# Patient Record
Sex: Female | Born: 1970 | Race: White | Hispanic: No | Smoking: Never smoker
Health system: Southern US, Community
[De-identification: ages and names within clinical notes are randomized; demographics above are authoritative.]

## PROBLEM LIST (undated history)

## (undated) DIAGNOSIS — I10 Essential (primary) hypertension: Secondary | ICD-10-CM

## (undated) DIAGNOSIS — E119 Type 2 diabetes mellitus without complications: Secondary | ICD-10-CM

## (undated) HISTORY — PX: TUBAL LIGATION: SHX77

---

## 1998-02-28 ENCOUNTER — Emergency Department (HOSPITAL_COMMUNITY): Admission: EM | Admit: 1998-02-28 | Discharge: 1998-02-28 | Payer: Self-pay | Admitting: Emergency Medicine

## 1998-04-23 ENCOUNTER — Inpatient Hospital Stay (HOSPITAL_COMMUNITY): Admission: AD | Admit: 1998-04-23 | Discharge: 1998-04-23 | Payer: Self-pay | Admitting: Obstetrics and Gynecology

## 1998-04-29 ENCOUNTER — Ambulatory Visit (HOSPITAL_COMMUNITY): Admission: RE | Admit: 1998-04-29 | Discharge: 1998-04-29 | Payer: Self-pay | Admitting: Obstetrics & Gynecology

## 1998-05-20 ENCOUNTER — Ambulatory Visit (HOSPITAL_COMMUNITY): Admission: RE | Admit: 1998-05-20 | Discharge: 1998-05-20 | Payer: Self-pay | Admitting: Obstetrics and Gynecology

## 1998-06-21 ENCOUNTER — Inpatient Hospital Stay (HOSPITAL_COMMUNITY): Admission: AD | Admit: 1998-06-21 | Discharge: 1998-06-21 | Payer: Self-pay | Admitting: *Deleted

## 1998-07-09 ENCOUNTER — Inpatient Hospital Stay (HOSPITAL_COMMUNITY): Admission: AD | Admit: 1998-07-09 | Discharge: 1998-07-09 | Payer: Self-pay | Admitting: Obstetrics and Gynecology

## 1998-07-10 ENCOUNTER — Inpatient Hospital Stay (HOSPITAL_COMMUNITY): Admission: AD | Admit: 1998-07-10 | Discharge: 1998-07-10 | Payer: Self-pay | Admitting: Obstetrics & Gynecology

## 1998-07-21 ENCOUNTER — Inpatient Hospital Stay (HOSPITAL_COMMUNITY): Admission: AD | Admit: 1998-07-21 | Discharge: 1998-07-24 | Payer: Self-pay | Admitting: Obstetrics & Gynecology

## 2003-01-06 ENCOUNTER — Encounter: Payer: Self-pay | Admitting: Emergency Medicine

## 2003-01-06 ENCOUNTER — Emergency Department (HOSPITAL_COMMUNITY): Admission: EM | Admit: 2003-01-06 | Discharge: 2003-01-07 | Payer: Self-pay | Admitting: Emergency Medicine

## 2003-04-26 ENCOUNTER — Other Ambulatory Visit: Admission: RE | Admit: 2003-04-26 | Discharge: 2003-04-26 | Payer: Self-pay | Admitting: Family Medicine

## 2004-04-24 ENCOUNTER — Other Ambulatory Visit: Admission: RE | Admit: 2004-04-24 | Discharge: 2004-04-24 | Payer: Self-pay | Admitting: Family Medicine

## 2005-04-13 ENCOUNTER — Ambulatory Visit (HOSPITAL_COMMUNITY): Admission: RE | Admit: 2005-04-13 | Discharge: 2005-04-13 | Payer: Self-pay | Admitting: Family Medicine

## 2005-06-17 ENCOUNTER — Other Ambulatory Visit: Admission: RE | Admit: 2005-06-17 | Discharge: 2005-06-17 | Payer: Self-pay | Admitting: Obstetrics and Gynecology

## 2007-11-18 ENCOUNTER — Other Ambulatory Visit: Admission: RE | Admit: 2007-11-18 | Discharge: 2007-11-18 | Payer: Self-pay | Admitting: Family Medicine

## 2008-06-26 ENCOUNTER — Inpatient Hospital Stay (HOSPITAL_COMMUNITY): Admission: AD | Admit: 2008-06-26 | Discharge: 2008-06-26 | Payer: Self-pay | Admitting: Obstetrics & Gynecology

## 2009-07-21 ENCOUNTER — Emergency Department (HOSPITAL_COMMUNITY): Admission: EM | Admit: 2009-07-21 | Discharge: 2009-07-22 | Payer: Self-pay | Admitting: Emergency Medicine

## 2009-08-28 ENCOUNTER — Ambulatory Visit (HOSPITAL_COMMUNITY): Admission: RE | Admit: 2009-08-28 | Discharge: 2009-08-28 | Payer: Self-pay | Admitting: Obstetrics and Gynecology

## 2009-10-02 ENCOUNTER — Ambulatory Visit (HOSPITAL_COMMUNITY): Admission: RE | Admit: 2009-10-02 | Discharge: 2009-10-02 | Payer: Self-pay | Admitting: Obstetrics & Gynecology

## 2009-12-09 ENCOUNTER — Ambulatory Visit: Payer: Self-pay | Admitting: Obstetrics & Gynecology

## 2009-12-09 ENCOUNTER — Inpatient Hospital Stay (HOSPITAL_COMMUNITY): Admission: AD | Admit: 2009-12-09 | Discharge: 2009-12-09 | Payer: Self-pay | Admitting: Obstetrics & Gynecology

## 2009-12-10 ENCOUNTER — Encounter: Payer: Self-pay | Admitting: Family

## 2009-12-12 ENCOUNTER — Ambulatory Visit: Payer: Self-pay | Admitting: Obstetrics & Gynecology

## 2009-12-16 ENCOUNTER — Ambulatory Visit (HOSPITAL_COMMUNITY): Admission: RE | Admit: 2009-12-16 | Discharge: 2009-12-16 | Payer: Self-pay | Admitting: Obstetrics & Gynecology

## 2009-12-16 ENCOUNTER — Ambulatory Visit: Payer: Self-pay | Admitting: Obstetrics & Gynecology

## 2009-12-16 ENCOUNTER — Encounter: Admission: RE | Admit: 2009-12-16 | Discharge: 2009-12-16 | Payer: Self-pay | Admitting: Obstetrics & Gynecology

## 2009-12-19 ENCOUNTER — Ambulatory Visit: Payer: Self-pay | Admitting: Obstetrics & Gynecology

## 2009-12-24 ENCOUNTER — Ambulatory Visit (HOSPITAL_COMMUNITY): Admission: RE | Admit: 2009-12-24 | Discharge: 2009-12-24 | Payer: Self-pay | Admitting: Obstetrics & Gynecology

## 2009-12-24 ENCOUNTER — Ambulatory Visit: Payer: Self-pay | Admitting: Obstetrics and Gynecology

## 2009-12-27 ENCOUNTER — Ambulatory Visit: Payer: Self-pay | Admitting: Obstetrics & Gynecology

## 2009-12-30 ENCOUNTER — Ambulatory Visit: Payer: Self-pay | Admitting: Obstetrics & Gynecology

## 2009-12-30 LAB — CONVERTED CEMR LAB
Chlamydia, DNA Probe: NEGATIVE
GC Probe Amp, Genital: NEGATIVE

## 2010-01-06 ENCOUNTER — Ambulatory Visit: Payer: Self-pay | Admitting: Obstetrics & Gynecology

## 2010-01-06 ENCOUNTER — Ambulatory Visit (HOSPITAL_COMMUNITY): Admission: RE | Admit: 2010-01-06 | Discharge: 2010-01-06 | Payer: Self-pay | Admitting: Obstetrics & Gynecology

## 2010-01-09 ENCOUNTER — Ambulatory Visit: Payer: Self-pay | Admitting: Obstetrics & Gynecology

## 2010-01-13 ENCOUNTER — Ambulatory Visit: Payer: Self-pay | Admitting: Obstetrics & Gynecology

## 2010-01-15 ENCOUNTER — Inpatient Hospital Stay (HOSPITAL_COMMUNITY): Admission: RE | Admit: 2010-01-15 | Discharge: 2010-01-18 | Payer: Self-pay | Admitting: Obstetrics & Gynecology

## 2010-01-15 ENCOUNTER — Ambulatory Visit: Payer: Self-pay | Admitting: Obstetrics & Gynecology

## 2010-01-15 ENCOUNTER — Encounter: Payer: Self-pay | Admitting: Obstetrics & Gynecology

## 2010-09-06 LAB — CBC
HCT: 27 % — ABNORMAL LOW (ref 36.0–46.0)
HCT: 31.2 % — ABNORMAL LOW (ref 36.0–46.0)
Hemoglobin: 10.5 g/dL — ABNORMAL LOW (ref 12.0–15.0)
Hemoglobin: 9.3 g/dL — ABNORMAL LOW (ref 12.0–15.0)
MCH: 28.2 pg (ref 26.0–34.0)
MCH: 29 pg (ref 26.0–34.0)
MCHC: 33.5 g/dL (ref 30.0–36.0)
MCHC: 34.3 g/dL (ref 30.0–36.0)
MCV: 84.1 fL (ref 78.0–100.0)
MCV: 84.6 fL (ref 78.0–100.0)
Platelets: 187 10*3/uL (ref 150–400)
Platelets: 200 10*3/uL (ref 150–400)
RBC: 3.2 MIL/uL — ABNORMAL LOW (ref 3.87–5.11)
RBC: 3.71 MIL/uL — ABNORMAL LOW (ref 3.87–5.11)
RDW: 14.8 % (ref 11.5–15.5)
RDW: 15.3 % (ref 11.5–15.5)
WBC: 10.1 10*3/uL (ref 4.0–10.5)
WBC: 9.3 10*3/uL (ref 4.0–10.5)

## 2010-09-06 LAB — RH IMMUNE GLOB WKUP(>/=20WKS)(NOT WOMEN'S HOSP): Fetal Screen: NEGATIVE

## 2010-09-06 LAB — BASIC METABOLIC PANEL
BUN: 4 mg/dL — ABNORMAL LOW (ref 6–23)
CO2: 25 mEq/L (ref 19–32)
Calcium: 8.8 mg/dL (ref 8.4–10.5)
Chloride: 107 mEq/L (ref 96–112)
Creatinine, Ser: 0.46 mg/dL (ref 0.4–1.2)
GFR calc Af Amer: >60 mL/min (ref >60)
GFR calc non Af Amer: >60 mL/min (ref >60)
Glucose, Bld: 140 mg/dL — ABNORMAL HIGH (ref 70–99)
Potassium: 3.7 mEq/L (ref 3.5–5.1)
Sodium: 138 mEq/L (ref 135–145)

## 2010-09-06 LAB — POCT URINALYSIS DIP (DEVICE)
Bilirubin Urine: NEGATIVE
Glucose, UA: NEGATIVE mg/dL
Glucose, UA: NEGATIVE mg/dL
Hgb urine dipstick: NEGATIVE
Hgb urine dipstick: NEGATIVE
Ketones, ur: NEGATIVE mg/dL
Ketones, ur: NEGATIVE mg/dL
Nitrite: NEGATIVE
Nitrite: NEGATIVE
Protein, ur: 30 mg/dL — AB
Protein, ur: 30 mg/dL — AB
Specific Gravity, Urine: 1.025 (ref 1.005–1.030)
Specific Gravity, Urine: >=1.03 (ref 1.005–1.030)
Urobilinogen, UA: 0.2 mg/dL (ref 0.0–1.0)
Urobilinogen, UA: 0.2 mg/dL (ref 0.0–1.0)
pH: 5.5 (ref 5.0–8.0)
pH: 6 (ref 5.0–8.0)

## 2010-09-06 LAB — GLUCOSE, CAPILLARY
Glucose-Capillary: 118 mg/dL — ABNORMAL HIGH (ref 70–99)
Glucose-Capillary: 92 mg/dL (ref 70–99)

## 2010-09-06 LAB — TYPE AND SCREEN
ABO/RH(D): O NEG
Antibody Screen: NEGATIVE

## 2010-09-06 LAB — SURGICAL PCR SCREEN
MRSA, PCR: NEGATIVE
Staphylococcus aureus: NEGATIVE

## 2010-09-06 LAB — CCBB MATERNAL DONOR DRAW

## 2010-09-06 LAB — RPR: RPR Ser Ql: NONREACTIVE

## 2010-09-07 LAB — POCT URINALYSIS DIP (DEVICE)
Bilirubin Urine: NEGATIVE
Glucose, UA: 100 mg/dL — AB
Glucose, UA: NEGATIVE mg/dL
Ketones, ur: 15 mg/dL — AB
Ketones, ur: 15 mg/dL — AB
Nitrite: NEGATIVE
Nitrite: NEGATIVE
Protein, ur: 30 mg/dL — AB
Protein, ur: 30 mg/dL — AB
Specific Gravity, Urine: >=1.03 (ref 1.005–1.030)
Urobilinogen, UA: 0.2 mg/dL (ref 0.0–1.0)
Urobilinogen, UA: 0.2 mg/dL (ref 0.0–1.0)
pH: 5.5 (ref 5.0–8.0)

## 2010-09-07 LAB — GLUCOSE, CAPILLARY: Glucose-Capillary: 124 mg/dL — ABNORMAL HIGH (ref 70–99)

## 2010-09-07 LAB — CBC
MCV: 85.8 fL (ref 78.0–100.0)
Platelets: 211 10*3/uL (ref 150–400)
WBC: 9 10*3/uL (ref 4.0–10.5)

## 2010-09-07 LAB — FIBRINOGEN: Fibrinogen: 652 mg/dL — ABNORMAL HIGH (ref 204–475)

## 2010-09-07 LAB — PROTIME-INR: Prothrombin Time: 12.9 seconds (ref 11.6–15.2)

## 2010-09-07 LAB — TYPE AND SCREEN: Antibody Screen: NEGATIVE

## 2010-10-06 LAB — CBC
HCT: 37.7 % (ref 36.0–46.0)
Hemoglobin: 13 g/dL (ref 12.0–15.0)
MCV: 86.4 fL (ref 78.0–100.0)
RBC: 4.36 MIL/uL (ref 3.87–5.11)
WBC: 8.1 10*3/uL (ref 4.0–10.5)

## 2010-10-06 LAB — GC/CHLAMYDIA PROBE AMP, GENITAL
Chlamydia, DNA Probe: NEGATIVE
GC Probe Amp, Genital: NEGATIVE

## 2010-10-06 LAB — TYPE AND SCREEN: Antibody Screen: NEGATIVE

## 2010-10-06 LAB — ABO/RH: ABO/RH(D): O NEG

## 2011-03-09 IMAGING — US US OB FOLLOW-UP
1 series · 14 of 28 positions shown · non-contrast
Comparison: none

OBSTETRICAL ULTRASOUND:
 This ultrasound exam was performed in the [HOSPITAL] Ultrasound Department.  The OB US report was generated in the AS system, and faxed to the ordering physician.  This report is also available in [HOSPITAL]?s AccessANYware and in [REDACTED] PACS.

[Series 1: us fetal bpp w/o nonstress · non-contrast · 0.39mm/px · 47 acquisitions, 14 frames shown]
[im 2/47]
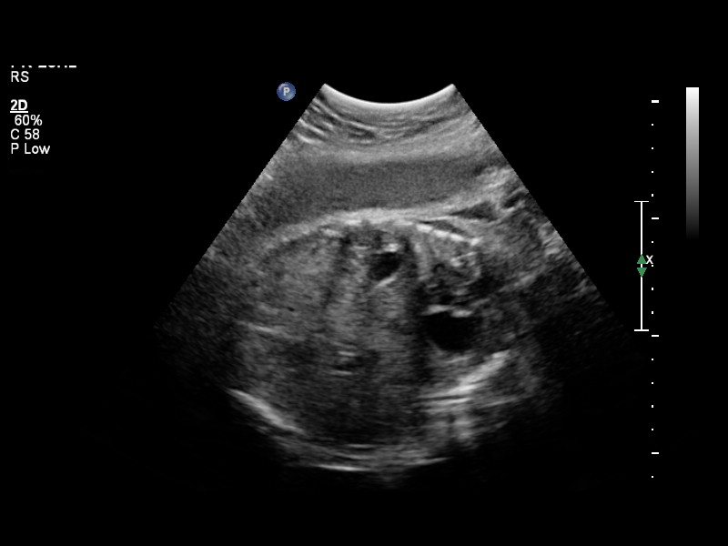
[im 6/47]
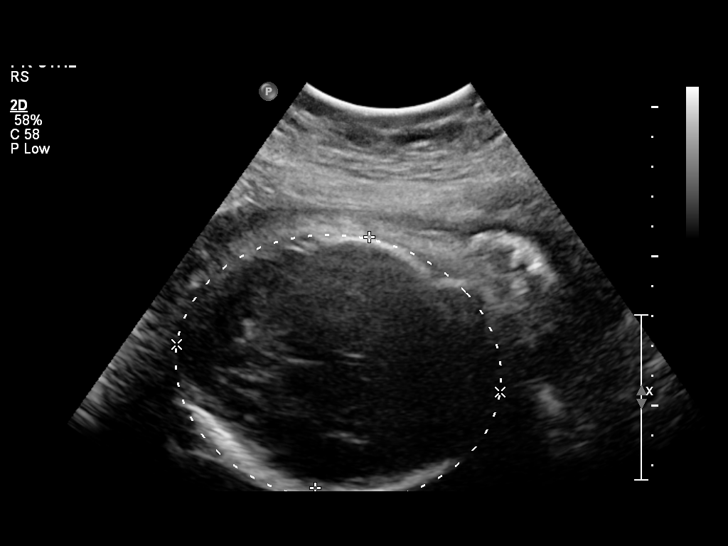
[im 9/47]
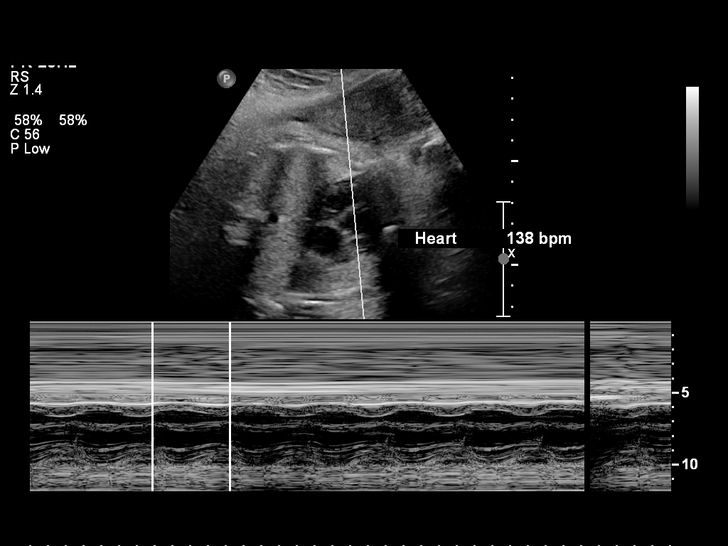
[im 12/47]
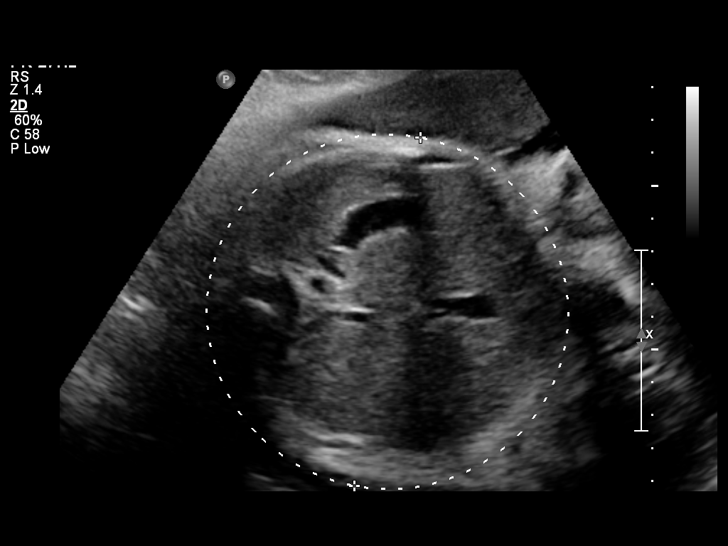
[im 16/47]
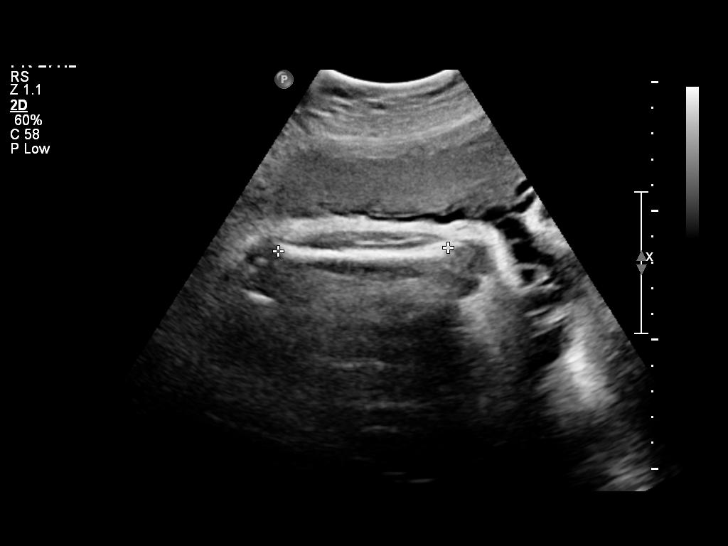
[im 19/47]
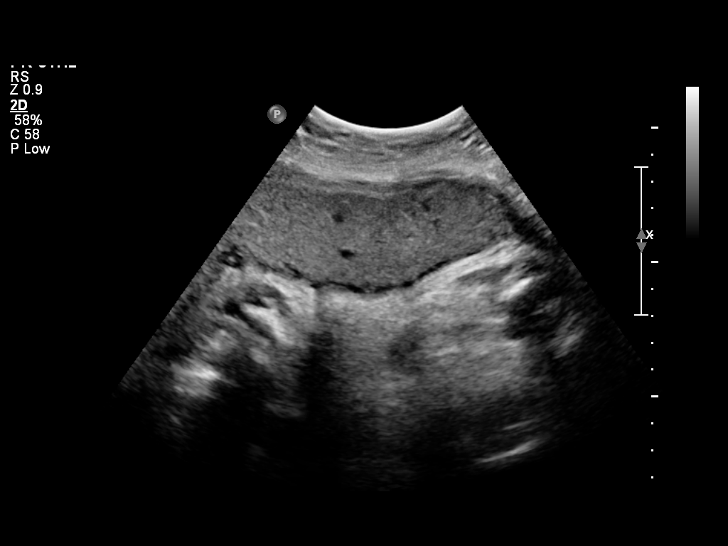
[im 23/47]
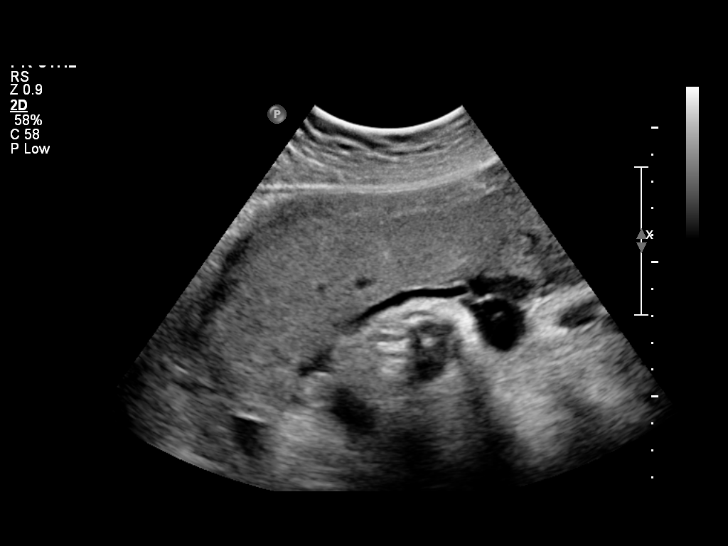
[im 26/47]
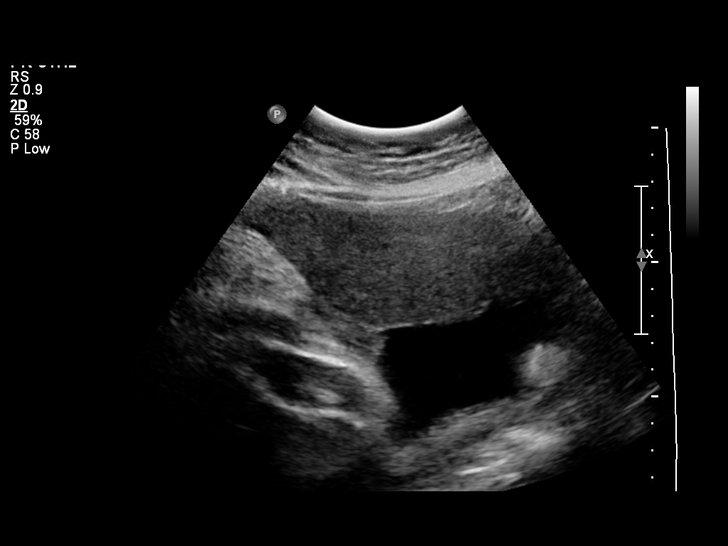
[im 29/47]
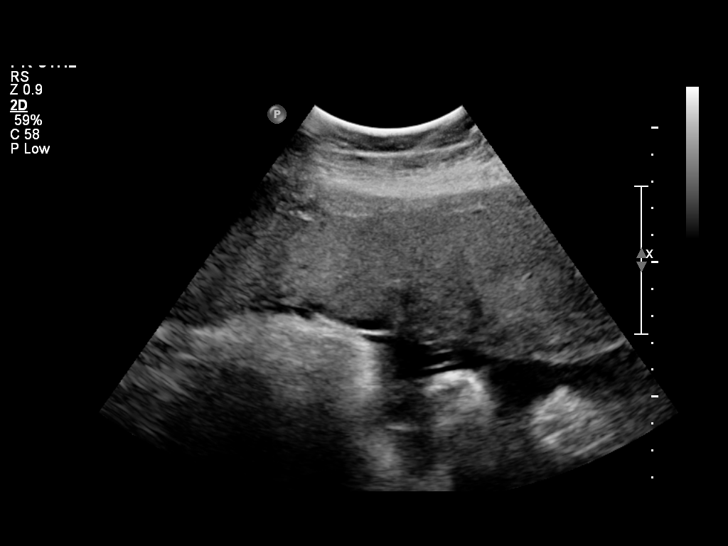
[im 33/47]
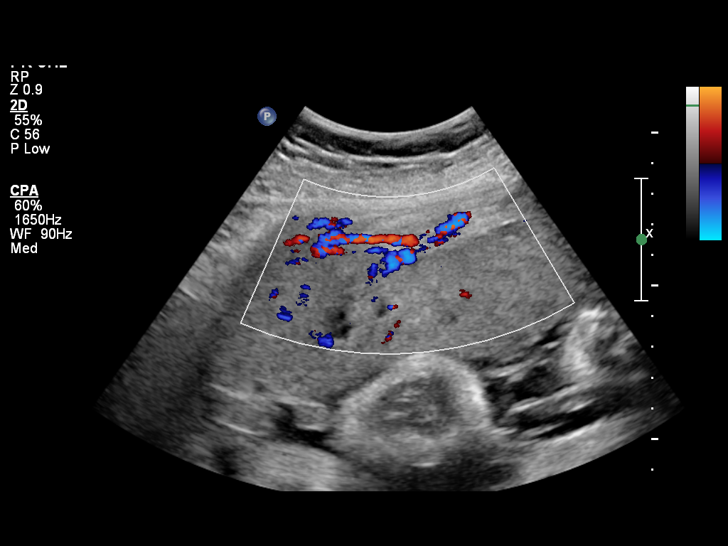
[im 36/47]
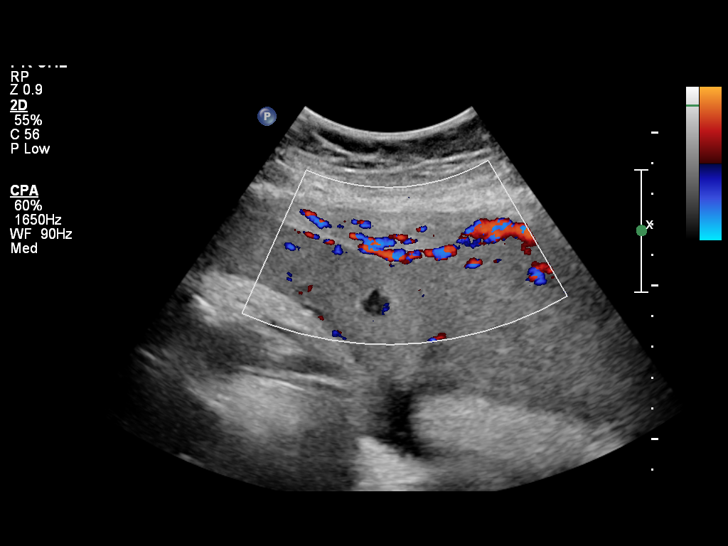
[im 40/47]
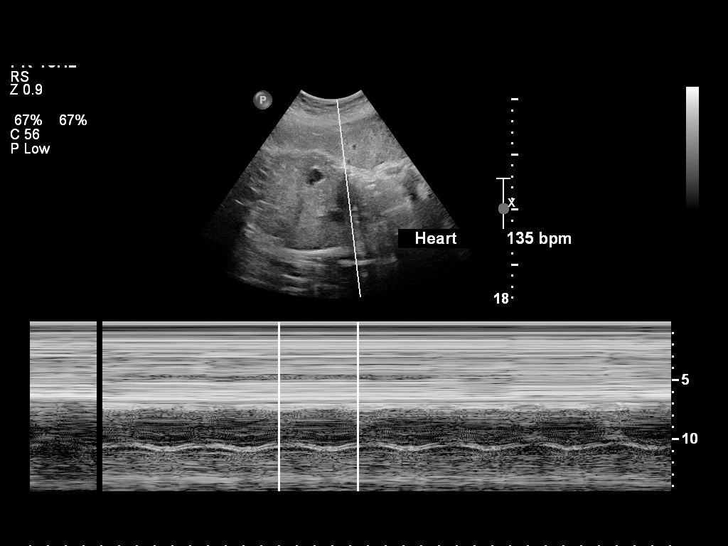
[im 43/47]
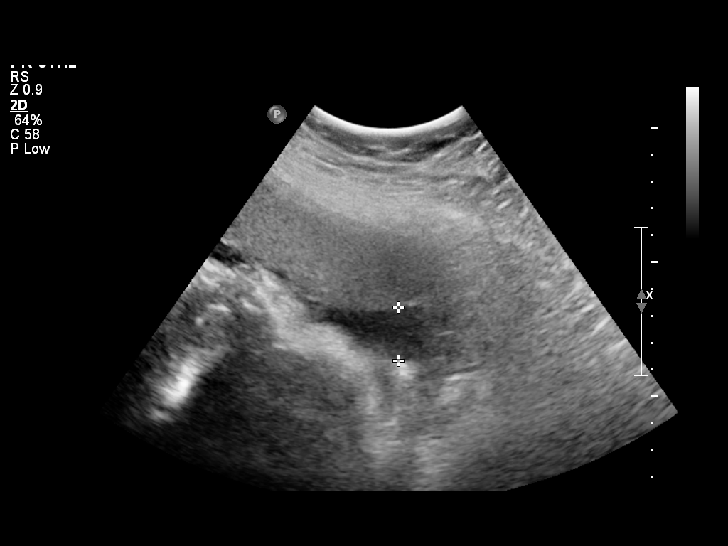
[im 47/47]
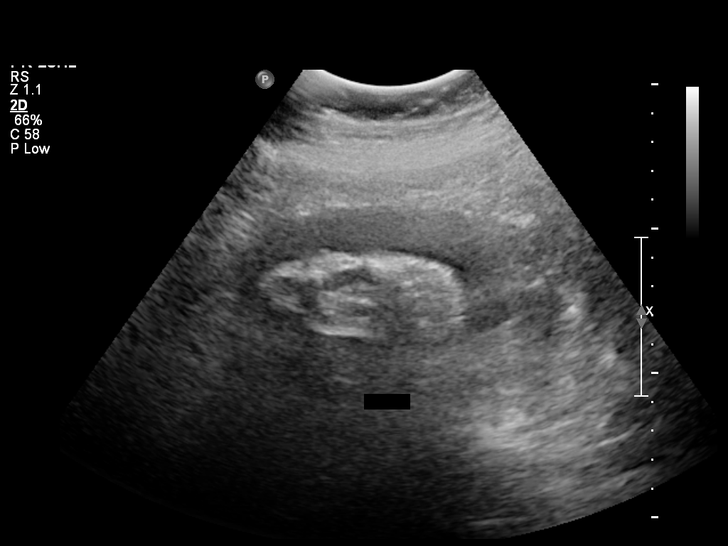

[14 of 28 positions shown; findings below may reference images not displayed]

IMPRESSION: See AS Obstetric US report.

## 2011-05-29 ENCOUNTER — Other Ambulatory Visit: Payer: Self-pay | Admitting: Physician Assistant

## 2011-05-29 ENCOUNTER — Other Ambulatory Visit (HOSPITAL_COMMUNITY)
Admission: RE | Admit: 2011-05-29 | Discharge: 2011-05-29 | Disposition: A | Discharge status: Home / Self Care | Payer: BC Managed Care – PPO | Source: Ambulatory Visit | Attending: Family Medicine | Admitting: Family Medicine

## 2011-05-29 DIAGNOSIS — Z01419 Encounter for gynecological examination (general) (routine) without abnormal findings: Secondary | ICD-10-CM | POA: Insufficient documentation

## 2012-01-29 ENCOUNTER — Other Ambulatory Visit: Payer: Self-pay | Admitting: Family Medicine

## 2012-01-29 DIAGNOSIS — Z1231 Encounter for screening mammogram for malignant neoplasm of breast: Secondary | ICD-10-CM

## 2012-02-15 ENCOUNTER — Ambulatory Visit
Admission: RE | Admit: 2012-02-15 | Discharge: 2012-02-15 | Disposition: A | Discharge status: Home / Self Care | Payer: BC Managed Care – PPO | Source: Ambulatory Visit | Attending: Family Medicine | Admitting: Family Medicine

## 2012-02-15 DIAGNOSIS — Z1231 Encounter for screening mammogram for malignant neoplasm of breast: Secondary | ICD-10-CM

## 2013-05-31 ENCOUNTER — Ambulatory Visit (HOSPITAL_COMMUNITY): Payer: 59 | Attending: Family Medicine | Admitting: Cardiology

## 2013-05-31 ENCOUNTER — Encounter (INDEPENDENT_AMBULATORY_CARE_PROVIDER_SITE_OTHER): Payer: Self-pay

## 2013-05-31 ENCOUNTER — Other Ambulatory Visit (HOSPITAL_COMMUNITY): Payer: Self-pay | Admitting: Family Medicine

## 2013-05-31 DIAGNOSIS — R0609 Other forms of dyspnea: Secondary | ICD-10-CM | POA: Insufficient documentation

## 2013-05-31 DIAGNOSIS — I10 Essential (primary) hypertension: Secondary | ICD-10-CM | POA: Insufficient documentation

## 2013-05-31 DIAGNOSIS — Z6841 Body Mass Index (BMI) 40.0 and over, adult: Secondary | ICD-10-CM | POA: Insufficient documentation

## 2013-05-31 DIAGNOSIS — E669 Obesity, unspecified: Secondary | ICD-10-CM | POA: Insufficient documentation

## 2013-05-31 DIAGNOSIS — R0602 Shortness of breath: Secondary | ICD-10-CM

## 2013-05-31 DIAGNOSIS — R0989 Other specified symptoms and signs involving the circulatory and respiratory systems: Secondary | ICD-10-CM | POA: Insufficient documentation

## 2013-05-31 DIAGNOSIS — R079 Chest pain, unspecified: Secondary | ICD-10-CM | POA: Insufficient documentation

## 2013-05-31 NOTE — Progress Notes (Signed)
Echo performed. 

## 2013-11-05 ENCOUNTER — Emergency Department (HOSPITAL_COMMUNITY)
Admission: EM | Admit: 2013-11-05 | Discharge: 2013-11-06 | Disposition: A | Discharge status: Home / Self Care | Payer: 59 | Attending: Emergency Medicine | Admitting: Emergency Medicine

## 2013-11-05 ENCOUNTER — Emergency Department (HOSPITAL_COMMUNITY): Payer: 59

## 2013-11-05 ENCOUNTER — Encounter (HOSPITAL_COMMUNITY): Payer: Self-pay | Admitting: Emergency Medicine

## 2013-11-05 DIAGNOSIS — F430 Acute stress reaction: Secondary | ICD-10-CM | POA: Insufficient documentation

## 2013-11-05 DIAGNOSIS — R739 Hyperglycemia, unspecified: Secondary | ICD-10-CM

## 2013-11-05 DIAGNOSIS — R079 Chest pain, unspecified: Secondary | ICD-10-CM

## 2013-11-05 DIAGNOSIS — R0602 Shortness of breath: Secondary | ICD-10-CM | POA: Insufficient documentation

## 2013-11-05 DIAGNOSIS — R7309 Other abnormal glucose: Secondary | ICD-10-CM | POA: Insufficient documentation

## 2013-11-05 DIAGNOSIS — F411 Generalized anxiety disorder: Secondary | ICD-10-CM | POA: Insufficient documentation

## 2013-11-05 DIAGNOSIS — R197 Diarrhea, unspecified: Secondary | ICD-10-CM | POA: Insufficient documentation

## 2013-11-05 DIAGNOSIS — R42 Dizziness and giddiness: Secondary | ICD-10-CM | POA: Insufficient documentation

## 2013-11-05 DIAGNOSIS — Z79899 Other long term (current) drug therapy: Secondary | ICD-10-CM | POA: Insufficient documentation

## 2013-11-05 DIAGNOSIS — E876 Hypokalemia: Secondary | ICD-10-CM | POA: Insufficient documentation

## 2013-11-05 HISTORY — DX: Essential (primary) hypertension: I10

## 2013-11-05 LAB — BASIC METABOLIC PANEL
BUN: 13 mg/dL (ref 6–23)
CALCIUM: 9.7 mg/dL (ref 8.4–10.5)
CHLORIDE: 92 meq/L — AB (ref 96–112)
CO2: 28 meq/L (ref 19–32)
Creatinine, Ser: 0.73 mg/dL (ref 0.50–1.10)
GFR calc Af Amer: >90 mL/min (ref >90)
GFR calc non Af Amer: >90 mL/min (ref >90)
GLUCOSE: 293 mg/dL — AB (ref 70–99)
POTASSIUM: 3.1 meq/L — AB (ref 3.7–5.3)
SODIUM: 135 meq/L — AB (ref 137–147)

## 2013-11-05 LAB — CBC
HCT: 39.2 % (ref 36.0–46.0)
Hemoglobin: 13.4 g/dL (ref 12.0–15.0)
MCH: 28.6 pg (ref 26.0–34.0)
MCHC: 34.2 g/dL (ref 30.0–36.0)
MCV: 83.8 fL (ref 78.0–100.0)
Platelets: 244 K/uL (ref 150–400)
RBC: 4.68 MIL/uL (ref 3.87–5.11)
RDW: 13.3 % (ref 11.5–15.5)
WBC: 9.1 K/uL (ref 4.0–10.5)

## 2013-11-05 LAB — I-STAT TROPONIN, ED
TROPONIN I, POC: 0 ng/mL (ref 0.00–0.08)
Troponin i, poc: 0 ng/mL (ref 0.00–0.08)

## 2013-11-05 MED ORDER — SODIUM CHLORIDE 0.9 % IV BOLUS (SEPSIS)
1000.0000 mL | Freq: Once | INTRAVENOUS | Status: AC
  Administered 2013-11-05: 1000 mL via INTRAVENOUS

## 2013-11-05 MED ORDER — POTASSIUM CHLORIDE CRYS ER 20 MEQ PO TBCR
40.0000 meq | EXTENDED_RELEASE_TABLET | Freq: Once | ORAL | Status: AC
  Administered 2013-11-05: 40 meq via ORAL
  Filled 2013-11-05: qty 2

## 2013-11-05 NOTE — ED Notes (Addendum)
[facility] and started having stressful conversation with daughter. Did report feeling anxious. Starting feeling left chest pain radiating to left arm around 1900. Felt SOB and dizziness with near syncope. Pt reports she stayed fully conscious. Had diarrhea during episode but no vomiting. No vision changes. Hx sleep apnea (wears CPAP at night), HTN. States that she has had these episodes in the past. One episode explained-was at work and suddenly felt very SOB with sharp chest pains. Also had vomiting. Seen at [place] where BP was noted to be 230 systolic. Was given BP medicine (doesn't remember) and morphine. Was diagnosed with gall stones. Still has gall bladder. C/o chronic pain. Family hx (mother) of CHF. No family hx of early cardiac death. In NAD. Ambulatory. A&O x4.

## 2013-11-05 NOTE — ED Notes (Signed)
Bed: WA20 Expected date:  Expected time:  Means of arrival:  Comments: EMS/42 yo chest pain

## 2013-11-05 NOTE — ED Notes (Signed)
Per EMS-pt out to eat with family today and stated that her and her daughter got into a stressful conversation and chest started hurting. Started a job working night shift and has had less sleep. Takes care of her children and grandchildren by herself and has felt more and more stressed. 12 Lead showed ST 112 bpm. Initially had left jaw pain and left shoulder pain. Has hx of neck pain from injury and causes intermittent jaw pain. Left shoulder tender to palpation.Takes HCTZ. Family hx of heart failure. Pt has no cardiac hx. VS: BP 136/76 HR 112 SpO2 100% on RA. Pain c/o 2/10. Pt took 81 mg Aspirin before EMS pick up.

## 2013-11-05 NOTE — ED Provider Notes (Signed)
CSN: [id number]     Arrival date & time 11/05/13  2023 History   First MD Initiated Contact with Patient 11/05/13 2046     Chief Complaint  Patient presents with  . Chest Pain  . Anxiety     (Consider location/radiation/quality/duration/timing/severity/associated sxs/prior Treatment) HPI  43 year old female who presents complaining of chest discomfort. Patient was brought here via EMS. Patient reports for the past 3-4 days she has been overworked, working 12hr night shift 4 days in a row not getting adequate sleep.  She also involved in a stressful conversation with her daughter while she was at a restaurant approximately 2 hrs ago.  She subsequently developed L upper chest pressure, radiates to her L arm with associated dizziness and sob.  Sxs has been persistent for the past 2 hrs.  She report having diarrhea at the restaurant and also diarrhea at home.  She did take a baby ASA and call EMS to bring her to ER.  She denies prior hx of cardiac disease.  Does take HCTZ PRN for fluid retention only.  Report family hx of heart failure.  Pt otherwise has not hx of HTN, HLD, DM or other cardiac risk factor.  Pt is not a smoker.  No prior hx of PE/DVT, no recent surgery, prolonged bed rest, hx cancer, unilateral leg swelling or calf pain, not taking birth control pill.    No past medical history on file. No past surgical history on file. No family history on file. History  Substance Use Topics  . Smoking status: Not on file  . Smokeless tobacco: Not on file  . Alcohol Use: Not on file   OB History   No data available     Review of Systems  All other systems reviewed and are negative.     Allergies  Codeine  Home Medications   Prior to Admission medications   Medication Sig Start Date End Date Taking? Authorizing Provider  B Complex-C (B-COMPLEX WITH VITAMIN C) tablet Take 1 tablet by mouth daily.   Yes Historical Provider, MD  Cholecalciferol (VITAMIN D3) 3000 UNITS TABS Take  3,000 Units by mouth daily.   Yes Historical Provider, MD  hydrochlorothiazide (HYDRODIURIL) 25 MG tablet Take 25-50 mg by mouth daily as needed (fluid).   Yes Historical Provider, MD  ibuprofen (ADVIL,MOTRIN) 200 MG tablet Take 800 mg by mouth every 6 (six) hours as needed for moderate pain.   Yes Historical Provider, MD  Multiple Vitamin (MULTIVITAMIN WITH MINERALS) TABS tablet Take 1 tablet by mouth daily.   Yes Historical Provider, MD   There were no vitals taken for this visit. Physical Exam  Nursing note and vitals reviewed. Constitutional: She is oriented to person, place, and time. She appears well-developed and well-nourished. No distress.  Moderately obese Caucasian female without any acute distress.    HENT:  Head: Atraumatic.  Eyes: Conjunctivae are normal.  Neck: Neck supple. No JVD present.  Cardiovascular: Normal rate and regular rhythm.   Pulmonary/Chest: Effort normal and breath sounds normal. No respiratory distress. She has no wheezes. She has no rales. She exhibits tenderness (tenderness to L upper chest on palpation without crepitus, emphysema, no rash. ).  Abdominal: Soft. Bowel sounds are normal. There is no tenderness.  Musculoskeletal: She exhibits no edema.  Neurological: She is alert and oriented to person, place, and time.  Skin: No rash noted.  Psychiatric: She has a normal mood and affect.    ED Course  Procedures (including critical care time)  9:10 PM Pt here with reproducible chest pain that is atypical of ACS.  CP may also be stress related.  Although she is mildly tachycardic, she score low on the Wells criteria.  TIMI 1 (from taking a baby ASA PTA). Work up initiated.  Ativan given for anxiety  11:05 PM EKG shows no acute ischemic changes. She has negative delta troponin.   Her CXR unremarkable.  She does have evidence of undiagnosed DM with CBG 293.  Evidence of hypokalemia of 3.1, supplementation given.  Pt report her sxs nearly completely resolved.   She is resting comfortably.    11:32 PM Pt however is tachycardic, and given her elevated CBG, will give IVF. Plan to d/c with metformin.    1:05 AM Pt is mildly tachycardic after receiving IVF.  She however ambulate without hypoxia, no cough, no hemoptysis and she feels fine.  I did mention that i cannot rule out a PE as a cause and offer option of D-dimer and possibility of chest CTA to r/o.  Pt prefers to go home, she understand the risk and is willing to return promptly if her sxs worsen.  Pt also agrees to f/u closely with her PCP.    Labs Review Labs Reviewed  BASIC METABOLIC PANEL - Abnormal; Notable for the following:    Sodium 135 (*)    Potassium 3.1 (*)    Chloride 92 (*)    Glucose, Bld 293 (*)    All other components within normal limits  URINALYSIS, ROUTINE W REFLEX MICROSCOPIC - Abnormal; Notable for the following:    Glucose, UA >1000 (*)    All other components within normal limits  CBC  URINE MICROSCOPIC-ADD ON  Rosezena SensorI-STAT TROPOININ, ED  Rosezena SensorI-STAT TROPOININ, ED    Imaging Review Dg Chest Port 1 View  11/05/2013   CLINICAL DATA:  CHEST PAIN ANXIETY  EXAM: PORTABLE CHEST - 1 VIEW  COMPARISON:  DG CHEST PORTABLE dated 08/03/2010  FINDINGS: The heart size and mediastinal contours are within normal limits. Both lungs are clear. The visualized skeletal structures are unremarkable.  IMPRESSION: No active disease.   Electronically Signed   By: Salome HolmesHector  Cooper M.D.   On: 11/05/2013 21:32     EKG Interpretation None      Date: 11/05/2013  Rate: 99  Rhythm: normal sinus rhythm  QRS Axis: normal  Intervals: normal  ST/T Wave abnormalities: normal  Conduction Disutrbances: none  Narrative Interpretation:   Old EKG Reviewed: No significant changes noted     MDM   Final diagnoses:  Chest pain at rest  Hyperglycemia  Hypokalemia    BP 125/72  Pulse 111  Temp(Src) 98.3 F (36.8 C) (Oral)  Resp 16  SpO2 98%  LMP 11/01/2013  I have reviewed nursing notes and vital  signs. I personally reviewed the imaging tests through PACS system  I reviewed available ER/hospitalization records thought the EMR     Fayrene HelperBowie De Libman, [place]-C 11/06/13 0114

## 2013-11-06 LAB — URINALYSIS, ROUTINE W REFLEX MICROSCOPIC
Bilirubin Urine: NEGATIVE
Glucose, UA: >1000 mg/dL — AB
Hgb urine dipstick: NEGATIVE
KETONES UR: NEGATIVE mg/dL
LEUKOCYTES UA: NEGATIVE
NITRITE: NEGATIVE
PROTEIN: NEGATIVE mg/dL
Specific Gravity, Urine: 1.011 (ref 1.005–1.030)
UROBILINOGEN UA: 0.2 mg/dL (ref 0.0–1.0)
pH: 6 (ref 5.0–8.0)

## 2013-11-06 LAB — URINE MICROSCOPIC-ADD ON

## 2013-11-06 MED ORDER — METFORMIN HCL 500 MG PO TABS: 500.0000 mg | ORAL_TABLET | Freq: Two times a day (BID) | ORAL | Status: AC

## 2013-11-06 NOTE — ED Notes (Signed)
Pt stayed above 97% while ambulating

## 2013-11-06 NOTE — Discharge Instructions (Signed)
You have evidence of diabetes, please follow up closely with your doctor for further management.  Please return if you develop cough, cough up blood, having shortness of breath or if you have other concerns.    Chest Pain Observation It is often hard to give a specific diagnosis for the cause of chest pain. Among other possibilities your symptoms might be caused by inadequate oxygen delivery to your heart (angina). Angina that is not treated or evaluated can lead to a heart attack (myocardial infarction) or death. Blood tests, electrocardiograms, and X-rays may have been done to help determine a possible cause of your chest pain. After evaluation and observation, your health care provider has determined that it is unlikely your pain was caused by an unstable condition that requires hospitalization. However, a full evaluation of your pain may need to be completed, with additional diagnostic testing as directed. It is very important to keep your follow-up appointments. Not keeping your follow-up appointments could result in permanent heart damage, disability, or death. If there is any problem keeping your follow-up appointments, you must call your health care provider. HOME CARE INSTRUCTIONS  Due to the slight chance that your pain could be angina, it is important to follow your health care provider's treatment plan and also maintain a healthy lifestyle:  Maintain or work toward achieving a healthy weight.  Stay physically active and exercise regularly.  Decrease your salt intake.  Eat a balanced, healthy diet. Talk to a dietician to learn about heart healthy foods.  Increase your fiber intake by including whole grains, vegetables, fruits, and nuts in your diet.  Avoid situations that cause stress, anger, or depression.  Take medicines as advised by your health care provider. Report any side effects to your health care provider. Do not stop medicines or adjust the dosages on your own.  Quit  smoking. Do not use nicotine patches or gum until you check with your health care provider.  Keep your blood pressure, blood sugar, and cholesterol levels within normal limits.  Limit alcohol intake to no more than 1 drink per day for women that are not pregnant and 2 drinks per day for men.  Do not abuse drugs. SEEK IMMEDIATE MEDICAL CARE IF: You have severe chest pain or pressure which may include symptoms such as:  You feel pain or pressure in you arms, neck, jaw, or back.  You have severe back or abdominal pain, feel sick to your stomach (nauseous), or throw up (vomit).  You are sweating profusely.  You are having a fast or irregular heartbeat.  You feel short of breath while at rest.  You notice increasing shortness of breath during rest, sleep, or with activity.  You have chest pain that does not get better after rest or after taking your usual medicine.  You wake from sleep with chest pain.  You are unable to sleep because you cannot breathe.  You develop a frequent cough or you are coughing up blood.  You feel dizzy, faint, or experience extreme fatigue.  You develop severe weakness, dizziness, fainting, or chills. Any of these symptoms may represent a serious problem that is an emergency. Do not wait to see if the symptoms will go away. Call your local emergency services (911 in the [place] Do not drive yourself to the hospital. MAKE SURE YOU:  Understand these instructions.  Will watch your condition.  Will get help right away if you are not doing well or get worse. Document Released: 07/11/2010 Document Revised: 02/08/2013  Document Reviewed: 12/08/2012 [facility] Patient Information 2014 [place], [place].  Hyperglycemia Hyperglycemia occurs when the glucose (sugar) in your blood is too high. Hyperglycemia can happen for many reasons, but it most often happens to people who do not know they have diabetes or are not managing their diabetes properly.  CAUSES    Whether you have diabetes or not, there are other causes of hyperglycemia. Hyperglycemia can occur when you have diabetes, but it can also occur in other situations that you might not be as aware of, such as: Diabetes  If you have diabetes and are having problems controlling your blood glucose, hyperglycemia could occur because of some of the following reasons:  Not following your meal plan.  Not taking your diabetes medications or not taking it properly.  Exercising less or doing less activity than you normally do.  Being sick. Pre-diabetes  This cannot be ignored. Before people develop Type 2 diabetes, they almost always have "pre-diabetes." This is when your blood glucose levels are higher than normal, but not yet high enough to be diagnosed as diabetes. Research has shown that some long-term damage to the body, especially the heart and circulatory system, may already be occurring during pre-diabetes. If you take action to manage your blood glucose when you have pre-diabetes, you may delay or prevent Type 2 diabetes from developing. Stress  If you have diabetes, you may be "diet" controlled or on oral medications or insulin to control your diabetes. However, you may find that your blood glucose is higher than usual in the hospital whether you have diabetes or not. This is often referred to as "stress hyperglycemia." Stress can elevate your blood glucose. This happens because of hormones put out by the body during times of stress. If stress has been the cause of your high blood glucose, it can be followed regularly by your caregiver. That way he/she can make sure your hyperglycemia does not continue to get worse or progress to diabetes. Steroids  Steroids are medications that act on the infection fighting system (immune system) to block inflammation or infection. One side effect can be a rise in blood glucose. Most people can produce enough extra insulin to allow for this rise, but for those  who cannot, steroids make blood glucose levels go even higher. It is not unusual for steroid treatments to "uncover" diabetes that is developing. It is not always possible to determine if the hyperglycemia will go away after the steroids are stopped. A special blood test called an A1c is sometimes done to determine if your blood glucose was elevated before the steroids were started. SYMPTOMS  Thirsty.  Frequent urination.  Dry mouth.  Blurred vision.  Tired or fatigue.  Weakness.  Sleepy.  Tingling in feet or leg. DIAGNOSIS  Diagnosis is made by monitoring blood glucose in one or all of the following ways:  A1c test. This is a chemical found in your blood.  Fingerstick blood glucose monitoring.  Laboratory results. TREATMENT  First, knowing the cause of the hyperglycemia is important before the hyperglycemia can be treated. Treatment may include, but is not be limited to:  Education.  Change or adjustment in medications.  Change or adjustment in meal plan.  Treatment for an illness, infection, etc.  More frequent blood glucose monitoring.  Change in exercise plan.  Decreasing or stopping steroids.  Lifestyle changes. HOME CARE INSTRUCTIONS   Test your blood glucose as directed.  Exercise regularly. Your caregiver will give you instructions about exercise. Pre-diabetes or diabetes  which comes on with stress is helped by exercising.  Eat wholesome, balanced meals. Eat often and at regular, fixed times. Your caregiver or nutritionist will give you a meal plan to guide your sugar intake.  Being at an ideal weight is important. If needed, losing as little as 10 to 15 pounds may help improve blood glucose levels. SEEK MEDICAL CARE IF:   You have questions about medicine, activity, or diet.  You continue to have symptoms (problems such as increased thirst, urination, or weight gain). SEEK IMMEDIATE MEDICAL CARE IF:   You are vomiting or have diarrhea.  Your  breath smells fruity.  You are breathing faster or slower.  You are very sleepy or incoherent.  You have numbness, tingling, or pain in your feet or hands.  You have chest pain.  Your symptoms get worse even though you have been following your caregiver's orders.  If you have any other questions or concerns. Document Released: 12/02/2000 Document Revised: 08/31/2011 Document Reviewed: 10/05/2011 [facility]ville[facility] Patient Information 2014 [place]ExitCare, [place]LLC.

## 2013-11-09 NOTE — ED Provider Notes (Signed)
Medical screening examination/treatment/procedure(s) were performed by non-physician practitioner and as supervising physician I was immediately available for consultation/collaboration.   EKG Interpretation   Date/Time:  Sunday Nov 05 2013 20:30:04 EDT Ventricular Rate:  99 PR Interval:  147 QRS Duration: 92 QT Interval:  359 QTC Calculation: 461 R Axis:   4 Text Interpretation:  Sinus rhythm Low voltage, precordial leads No old  tracing to compare Confirmed by [facility] HospitalWENTZ  MD, ELLIOTT [id number]) on 11/05/2013  10:54:50 PM       Juliet RudeNathan R. Rubin PayorPickering, MD 11/09/13 [phone]

## 2015-02-03 IMAGING — CR DG CHEST 1V PORT
1 series · 1 of 1 positions shown · non-contrast
Comparison: DG CHEST PORTABLE dated 08/03/2010

CLINICAL DATA: CHEST PAIN ANXIETY

EXAM:
PORTABLE CHEST - 1 VIEW

[AP]
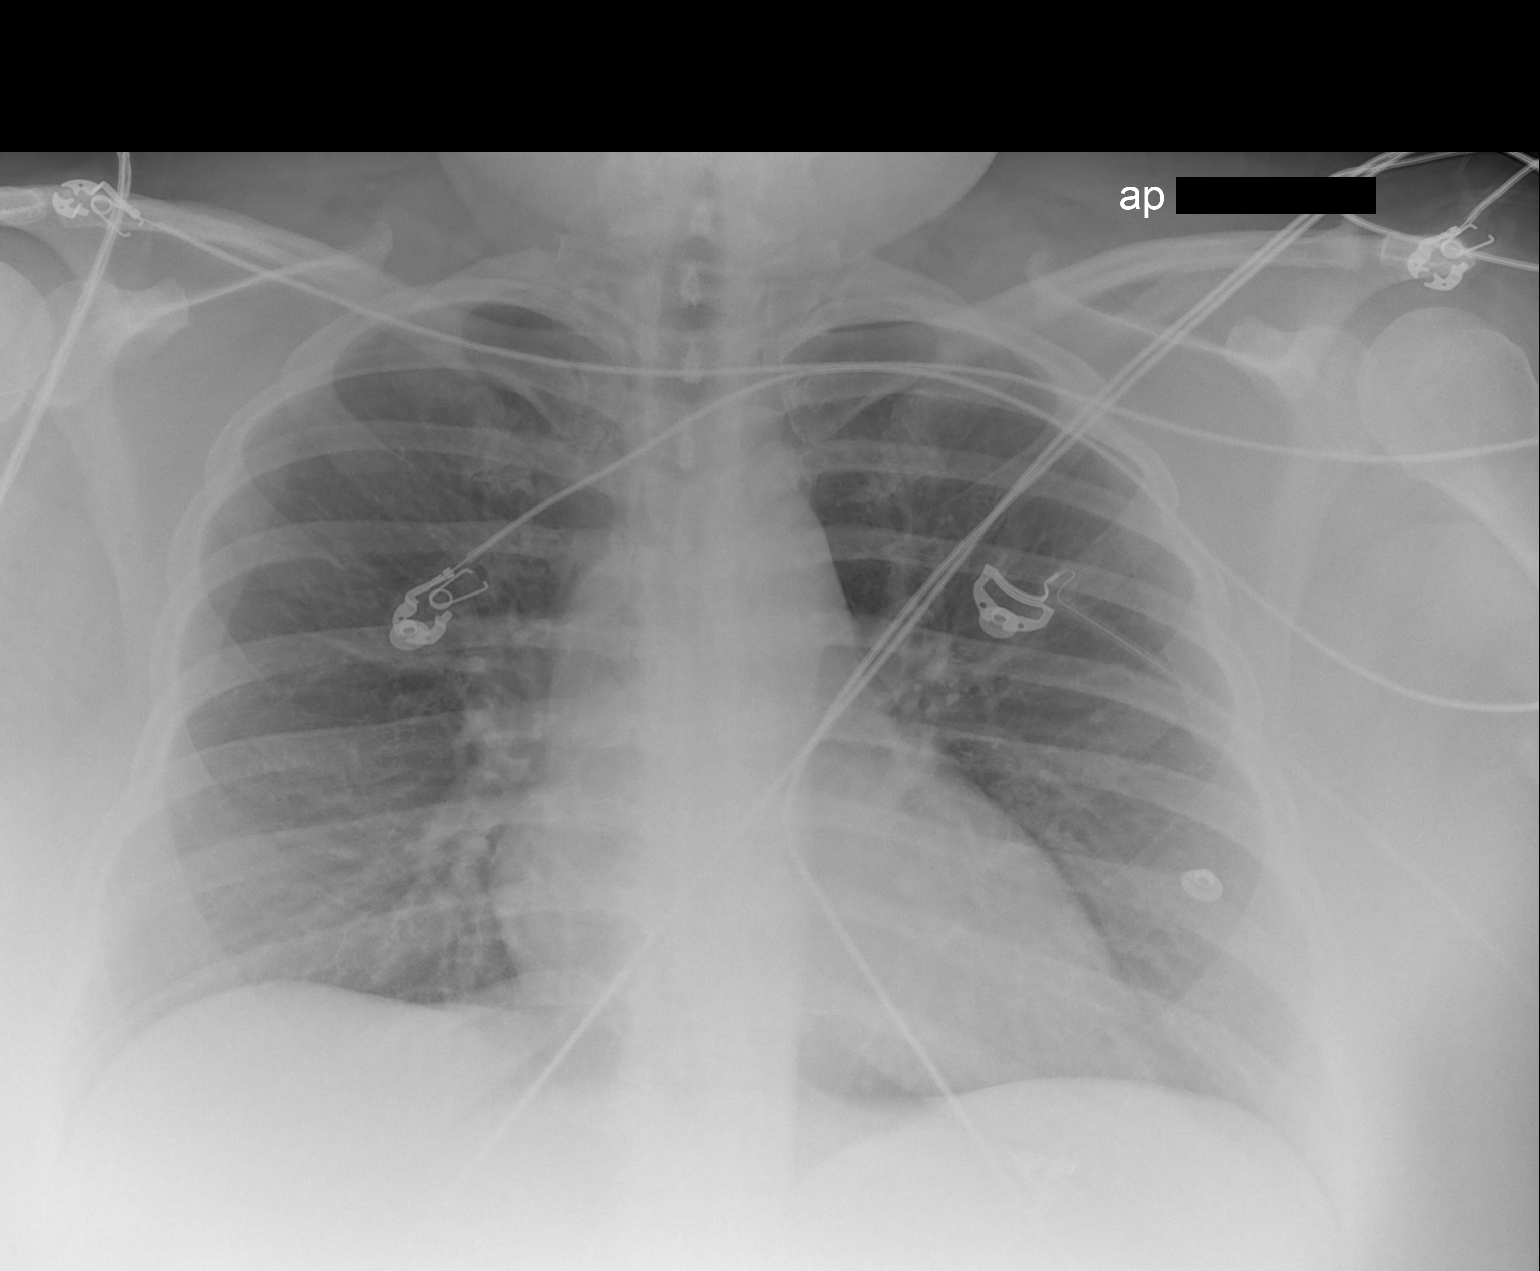

[1 of 1 positions shown; findings below may reference images not displayed]

FINDINGS: The heart size and mediastinal contours are within normal limits.
Both lungs are clear. The visualized skeletal structures are
unremarkable.
IMPRESSION: No active disease.

## 2015-07-31 ENCOUNTER — Emergency Department (HOSPITAL_COMMUNITY)
Admission: EM | Admit: 2015-07-31 | Discharge: 2015-07-31 | Disposition: A | Discharge status: Home / Self Care | Payer: Self-pay | Attending: Emergency Medicine | Admitting: Emergency Medicine

## 2015-07-31 ENCOUNTER — Encounter (HOSPITAL_COMMUNITY): Payer: Self-pay | Admitting: Family Medicine

## 2015-07-31 DIAGNOSIS — R220 Localized swelling, mass and lump, head: Secondary | ICD-10-CM | POA: Insufficient documentation

## 2015-07-31 DIAGNOSIS — E119 Type 2 diabetes mellitus without complications: Secondary | ICD-10-CM | POA: Insufficient documentation

## 2015-07-31 DIAGNOSIS — I10 Essential (primary) hypertension: Secondary | ICD-10-CM | POA: Insufficient documentation

## 2015-07-31 HISTORY — DX: Type 2 diabetes mellitus without complications: E11.9

## 2015-07-31 NOTE — ED Notes (Addendum)
Patient called 2X for repeat vitals with no response

## 2015-07-31 NOTE — ED Notes (Signed)
Pt here for swelling to throat. sts she noticed it yesterday. sts making her acid reflux worse and making it hard to swallow.

## 2017-02-24 ENCOUNTER — Other Ambulatory Visit: Payer: Self-pay | Admitting: Family Medicine

## 2017-02-24 DIAGNOSIS — Z1231 Encounter for screening mammogram for malignant neoplasm of breast: Secondary | ICD-10-CM

## 2017-03-01 ENCOUNTER — Ambulatory Visit
Admission: RE | Admit: 2017-03-01 | Discharge: 2017-03-01 | Disposition: A | Discharge status: Home / Self Care | Payer: No Typology Code available for payment source | Source: Ambulatory Visit | Attending: Family Medicine | Admitting: Family Medicine

## 2017-03-01 DIAGNOSIS — Z1231 Encounter for screening mammogram for malignant neoplasm of breast: Secondary | ICD-10-CM

## 2018-03-21 ENCOUNTER — Other Ambulatory Visit: Payer: Self-pay

## 2018-03-21 ENCOUNTER — Emergency Department (HOSPITAL_COMMUNITY): Payer: Self-pay

## 2018-03-21 ENCOUNTER — Emergency Department (HOSPITAL_COMMUNITY)
Admission: EM | Admit: 2018-03-21 | Discharge: 2018-03-21 | Disposition: A | Discharge status: Home / Self Care | Payer: Self-pay | Attending: Emergency Medicine | Admitting: Emergency Medicine

## 2018-03-21 ENCOUNTER — Encounter (HOSPITAL_COMMUNITY): Payer: Self-pay | Admitting: *Deleted

## 2018-03-21 DIAGNOSIS — K8020 Calculus of gallbladder without cholecystitis without obstruction: Secondary | ICD-10-CM | POA: Insufficient documentation

## 2018-03-21 DIAGNOSIS — I10 Essential (primary) hypertension: Secondary | ICD-10-CM | POA: Insufficient documentation

## 2018-03-21 DIAGNOSIS — E876 Hypokalemia: Secondary | ICD-10-CM | POA: Insufficient documentation

## 2018-03-21 DIAGNOSIS — Z79899 Other long term (current) drug therapy: Secondary | ICD-10-CM | POA: Insufficient documentation

## 2018-03-21 DIAGNOSIS — R10811 Right upper quadrant abdominal tenderness: Secondary | ICD-10-CM

## 2018-03-21 DIAGNOSIS — Z7984 Long term (current) use of oral hypoglycemic drugs: Secondary | ICD-10-CM | POA: Insufficient documentation

## 2018-03-21 DIAGNOSIS — E119 Type 2 diabetes mellitus without complications: Secondary | ICD-10-CM | POA: Insufficient documentation

## 2018-03-21 LAB — CBC WITH DIFFERENTIAL/PLATELET
ABS IMMATURE GRANULOCYTES: 0 10*3/uL (ref 0.0–0.1)
Basophils Absolute: 0.1 10*3/uL (ref 0.0–0.1)
Basophils Relative: 1 %
EOS ABS: 0.3 10*3/uL (ref 0.0–0.7)
Eosinophils Relative: 4 %
HEMATOCRIT: 39.6 % (ref 36.0–46.0)
Hemoglobin: 13 g/dL (ref 12.0–15.0)
Immature Granulocytes: 0 %
LYMPHS ABS: 2.4 10*3/uL (ref 0.7–4.0)
LYMPHS PCT: 30 %
MCH: 29.3 pg (ref 26.0–34.0)
MCHC: 32.8 g/dL (ref 30.0–36.0)
MCV: 89.2 fL (ref 78.0–100.0)
MONO ABS: 0.5 10*3/uL (ref 0.1–1.0)
Monocytes Relative: 6 %
NEUTROS PCT: 59 %
Neutro Abs: 4.7 10*3/uL (ref 1.7–7.7)
Platelets: 205 10*3/uL (ref 150–400)
RBC: 4.44 MIL/uL (ref 3.87–5.11)
RDW: 13.2 % (ref 11.5–15.5)
WBC: 7.9 10*3/uL (ref 4.0–10.5)

## 2018-03-21 LAB — COMPREHENSIVE METABOLIC PANEL
ALT: 25 U/L (ref 0–44)
ANION GAP: 7 (ref 5–15)
AST: 19 U/L (ref 15–41)
Albumin: 3.3 g/dL — ABNORMAL LOW (ref 3.5–5.0)
Alkaline Phosphatase: 48 U/L (ref 38–126)
BUN: 5 mg/dL — ABNORMAL LOW (ref 6–20)
CHLORIDE: 105 mmol/L (ref 98–111)
CO2: 28 mmol/L (ref 22–32)
CREATININE: 0.65 mg/dL (ref 0.44–1.00)
Calcium: 8.7 mg/dL — ABNORMAL LOW (ref 8.9–10.3)
GFR calc Af Amer: >60 mL/min (ref >60)
Glucose, Bld: 121 mg/dL — ABNORMAL HIGH (ref 70–99)
POTASSIUM: 3.1 mmol/L — AB (ref 3.5–5.1)
SODIUM: 140 mmol/L (ref 135–145)
Total Bilirubin: 0.6 mg/dL (ref 0.3–1.2)
Total Protein: 6 g/dL — ABNORMAL LOW (ref 6.5–8.1)

## 2018-03-21 LAB — URINALYSIS, ROUTINE W REFLEX MICROSCOPIC
BILIRUBIN URINE: NEGATIVE
Glucose, UA: NEGATIVE mg/dL
Hgb urine dipstick: NEGATIVE
Ketones, ur: NEGATIVE mg/dL
Leukocytes, UA: NEGATIVE
Nitrite: NEGATIVE
PH: 6 (ref 5.0–8.0)
PROTEIN: NEGATIVE mg/dL
SPECIFIC GRAVITY, URINE: 1.002 — AB (ref 1.005–1.030)

## 2018-03-21 LAB — I-STAT BETA HCG BLOOD, ED (MC, WL, AP ONLY): I-stat hCG, quantitative: <5 m[IU]/mL (ref <5)

## 2018-03-21 LAB — LIPASE, BLOOD: LIPASE: 27 U/L (ref 11–51)

## 2018-03-21 MED ORDER — SODIUM CHLORIDE 0.9 % IV BOLUS
1000.0000 mL | Freq: Once | INTRAVENOUS | Status: AC
  Administered 2018-03-21: 1000 mL via INTRAVENOUS

## 2018-03-21 MED ORDER — PANTOPRAZOLE SODIUM 20 MG PO TBEC: 20.0000 mg | DELAYED_RELEASE_TABLET | Freq: Every day | ORAL | 1 refills | Status: AC

## 2018-03-21 MED ORDER — ONDANSETRON HCL 4 MG/2ML IJ SOLN
4.0000 mg | Freq: Once | INTRAMUSCULAR | Status: AC
  Administered 2018-03-21: 4 mg via INTRAVENOUS
  Filled 2018-03-21: qty 2

## 2018-03-21 MED ORDER — DICYCLOMINE HCL 20 MG PO TABS: 20.0000 mg | ORAL_TABLET | Freq: Two times a day (BID) | ORAL | 0 refills | Status: AC

## 2018-03-21 MED ORDER — FAMOTIDINE 20 MG PO TABS: 20.0000 mg | ORAL_TABLET | Freq: Two times a day (BID) | ORAL | 0 refills | Status: AC

## 2018-03-21 MED ORDER — DICYCLOMINE HCL 10 MG PO CAPS
20.0000 mg | ORAL_CAPSULE | Freq: Once | ORAL | Status: AC
  Administered 2018-03-21: 20 mg via ORAL
  Filled 2018-03-21: qty 2

## 2018-03-21 MED ORDER — ONDANSETRON 4 MG PO TBDP: 4.0000 mg | ORAL_TABLET | Freq: Three times a day (TID) | ORAL | 0 refills | Status: AC | PRN

## 2018-03-21 MED ORDER — MORPHINE SULFATE (PF) 4 MG/ML IV SOLN
4.0000 mg | Freq: Once | INTRAVENOUS | Status: AC
  Administered 2018-03-21: 4 mg via INTRAVENOUS
  Filled 2018-03-21: qty 1

## 2018-03-21 MED ORDER — POTASSIUM CHLORIDE CRYS ER 20 MEQ PO TBCR
40.0000 meq | EXTENDED_RELEASE_TABLET | Freq: Once | ORAL | Status: AC
  Administered 2018-03-21: 40 meq via ORAL
  Filled 2018-03-21: qty 2

## 2018-03-21 NOTE — ED Triage Notes (Signed)
C/o right side lower abd pain onset 1 month ago, c/o nausea, states she has been on the Keto diet and lost 70 pounds, also has a history of gallstones.

## 2018-03-21 NOTE — Discharge Instructions (Addendum)
° °  Diet: Start with a clear liquid diet, progressed to a full liquid diet, and then bland solids as you are able. Please adhere to the enclosed dietary suggestions.  In general, avoid NSAIDs (i.e. ibuprofen, naproxen, etc.), caffeine, alcohol, spicy foods, fatty foods, or any other foods that seem to cause your symptoms to arise.  Protonix: Take this medication daily, 20-30 minutes prior to your first meal, for the next 8 weeks.  Continue to take this medication even if you begin to feel better.  Pepcid: Take this medication twice a day for the next 5 days.  Bentyl: May use the Bentyl, as needed, for abdominal discomfort.  Zofran: May use Zofran, as needed, for nausea.  Follow-up: Please follow-up with the general surgeon on this matter. May also need to follow up with the gastroenterologist.  Return: Return to the ED for significantly worsening symptoms, persistent vomiting, persistent fever, vomiting blood, blood in the stools, dark stools, or any other major concerns.  For prescription assistance, may try using prescription discount sites or apps, such as goodrx.com   Your potassium was noted to be low today.  Treating this may include adjusting her diet accordingly, but follow-up with your primary care provider for repeat testing and management is recommended.

## 2018-03-21 NOTE — ED Notes (Signed)
Patient transported to Ultrasound 

## 2018-03-21 NOTE — ED Notes (Signed)
The pt has been on the  Keto diet and has lost 70 lbs  abd pain and she thinks the diet has caused her gallbladder to act uo

## 2018-03-21 NOTE — ED Provider Notes (Signed)
[facility] EMERGENCY DEPARTMENT Provider Note   CSN: [id number] Arrival date & time: 03/21/18  4098     History   Chief Complaint Chief Complaint  Patient presents with  . Abdominal Pain    HPI Michelle Harrington is a 47 y.o. female.  HPI   Michelle Harrington is a 47 y.o. female, with a history of DM, HTN, and cholelithiasis, presenting to the ED with abdominal pain beginning about a month ago. Pain is RUQ, usually dull, sometimes sharp, waxing and waning, worsening since onset, currently 8/10, with referred pain to right shoulder. Worse with eating fatty foods. Accompanied by nausea, bloating sensation, and belching.   States she was diagnosed with gallstones years ago, was told to see a Careers adviser for cholecystectomy, was unable to afford the surgery at that time.   She started the Keto diet Jan 2019 and has since lost 70 lbs.   Denies fever/chills, SOB, vomiting, diarrhea, urinary symptoms, chest pain, or any other complaints.    Past Medical History:  Diagnosis Date  . Diabetes mellitus without complication (HCC)   . Hypertension     There are no active problems to display for this patient.   Past Surgical History:  Procedure Laterality Date  . TUBAL LIGATION       OB History   None      Home Medications    Prior to Admission medications   Medication Sig Start Date End Date Taking? Authorizing Provider  B Complex-C (B-COMPLEX WITH VITAMIN C) tablet Take 1 tablet by mouth daily.   Yes [provider]  Biotin w/ Vitamins C & E (HAIR/SKIN/NAILS PO) Take 1 tablet by mouth every morning.   Yes [provider]  hydrochlorothiazide (HYDRODIURIL) 25 MG tablet Take 25 mg by mouth daily.    Yes [provider]  ibuprofen (ADVIL,MOTRIN) 200 MG tablet Take 400-600 mg by mouth every 8 (eight) hours as needed for moderate pain.    Yes [provider]  magnesium oxide (MAG-OX) 400 MG tablet Take 800 mg by mouth daily.    Yes [provider]  metFORMIN (GLUCOPHAGE) 500 MG tablet Take 1 tablet (500 mg total) by mouth 2 (two) times daily with a meal. Patient taking differently: Take 500 mg by mouth daily as needed (depending on meal).  11/06/13  Yes Fayrene Helper, PA-C  Misc Natural Products (ADRENAL PO) Take 2 capsules by mouth every morning.   Yes [provider]  Multiple Vitamin (MULTIVITAMIN WITH MINERALS) TABS tablet Take 1 tablet by mouth daily.   Yes [provider]  sodium chloride (OCEAN) 0.65 % SOLN nasal spray Place 1 spray into both nostrils as needed for congestion.   Yes [provider]  dicyclomine (BENTYL) 20 MG tablet Take 1 tablet (20 mg total) by mouth 2 (two) times daily. 03/21/18   Joy, Shawn C, PA-C  famotidine (PEPCID) 20 MG tablet Take 1 tablet (20 mg total) by mouth 2 (two) times daily for 5 days. 03/21/18 03/26/18  Joy, Shawn C, PA-C  ondansetron (ZOFRAN ODT) 4 MG disintegrating tablet Take 1 tablet (4 mg total) by mouth every 8 (eight) hours as needed for nausea or vomiting. 03/21/18   Joy, Shawn C, PA-C  pantoprazole (PROTONIX) 20 MG tablet Take 1 tablet (20 mg total) by mouth daily. 03/21/18 05/20/18  Anselm Pancoast, PA-C    Family History Family History  Problem Relation Age of Onset  . Breast cancer Maternal Grandmother  Social History Social History   Tobacco Use  . Smoking status: Never Smoker  . Smokeless tobacco: Never Used  Substance Use Topics  . Alcohol use: No  . Drug use: No     Allergies   Codeine   Review of Systems Review of Systems  Constitutional: Negative for chills, diaphoresis and fever.  Respiratory: Negative for shortness of breath.   Cardiovascular: Negative for chest pain.  Gastrointestinal: Positive for abdominal pain and nausea. Negative for blood in stool, constipation, diarrhea and vomiting.  Genitourinary: Negative for dysuria and hematuria.  Musculoskeletal: Negative for back pain.  Neurological: Negative  for weakness and numbness.  All other systems reviewed and are negative.    Physical Exam Updated Vital Signs BP (!) 143/63 (BP Location: Right Arm)   Pulse 74   Temp 97.6 F (36.4 C) (Oral)   Resp 18   Ht 5\' 3"  (1.6 m)   Wt 108.9 kg   SpO2 95%   BMI 42.51 kg/m   Physical Exam  Constitutional: She appears well-developed and well-nourished. No distress.  HENT:  Head: Normocephalic and atraumatic.  Eyes: Conjunctivae are normal.  Neck: Neck supple.  Cardiovascular: Normal rate, regular rhythm, normal heart sounds and intact distal pulses.  Pulmonary/Chest: Effort normal and breath sounds normal. No respiratory distress.  Abdominal: Soft. Bowel sounds are normal. There is tenderness in the right upper quadrant. There is no guarding.  Musculoskeletal: She exhibits no edema.  Lymphadenopathy:    She has no cervical adenopathy.  Neurological: She is alert.  Skin: Skin is warm and dry. She is not diaphoretic.  Psychiatric: She has a normal mood and affect. Her behavior is normal.  Nursing note and vitals reviewed.    ED Treatments / Results  Labs (all labs ordered are listed, but only abnormal results are displayed) Labs Reviewed  COMPREHENSIVE METABOLIC PANEL - Abnormal; Notable for the following components:      Result Value   Potassium 3.1 (*)    Glucose, Bld 121 (*)    BUN 5 (*)    Calcium 8.7 (*)    Total Protein 6.0 (*)    Albumin 3.3 (*)    All other components within normal limits  URINALYSIS, ROUTINE W REFLEX MICROSCOPIC - Abnormal; Notable for the following components:   Color, Urine STRAW (*)    Specific Gravity, Urine 1.002 (*)    All other components within normal limits  LIPASE, BLOOD  CBC WITH DIFFERENTIAL/PLATELET  I-STAT BETA HCG BLOOD, ED ([facility], [facility], [facility] ONLY)    EKG None  Radiology US Abdomen Limited Ruq  Result Date: 03/21/2018 CLINICAL DATA:  Right upper quadrant pain EXAM: ULTRASOUND ABDOMEN LIMITED RIGHT UPPER QUADRANT COMPARISON:  September 02, 2010 FINDINGS: Gallbladder: Within the gallbladder, there are multiple echogenic foci which move and shadow consistent with cholelithiasis. There is no gallbladder wall thickening or pericholecystic fluid. No sonographic Murphy sign noted by sonographer. Common bile duct: Diameter: 5 mm. No intrahepatic or extrahepatic biliary duct dilatation. Liver: No focal lesion identified. Liver echogenicity is inhomogeneous and diffusely increased. Portal vein is patent on color Doppler imaging with normal direction of blood flow towards the liver. IMPRESSION: 1. Cholelithiasis. No gallbladder wall thickening or pericholecystic fluid. 2. Increase in liver echogenicity with inhomogeneity to the echogenicity, findings indicative of hepatic steatosis. While no focal liver lesions are evident on this study, it must be cautioned that the sensitivity of ultrasound for detection of focal liver lesions is diminished in this circumstance. Electronically Signed  By: Bretta Bang III M.D.   On: 03/21/2018 07:53    Procedures Procedures (including critical care time)  Medications Ordered in ED Medications  potassium chloride SA (K-DUR,KLOR-CON) CR tablet 40 mEq (has no administration in time range)  sodium chloride 0.9 % bolus 1,000 mL (1,000 mLs Intravenous New Bag/Given 03/21/18 0713)  ondansetron (ZOFRAN) injection 4 mg (4 mg Intravenous Given 03/21/18 0713)  morphine 4 MG/ML injection 4 mg (4 mg Intravenous Given 03/21/18 0713)  dicyclomine (BENTYL) capsule 20 mg (20 mg Oral Given 03/21/18 0840)     Initial Impression / Assessment and Plan / ED Course  I have reviewed the triage vital signs and the nursing notes.  Pertinent labs & imaging results that were available during my care of the patient were reviewed by me and considered in my medical decision making (see chart for details).     Patient presents with abdominal pain. Patient is nontoxic appearing, afebrile, not tachycardic, not tachypneic, not  hypotensive, maintains excellent SPO2 on room air, and is in no apparent distress.  No leukocytosis.  Hypokalemia, but otherwise labs reassuring.  Cholelithiasis noted on ultrasound.  General surgery and GI follow-up. The patient was given instructions for home care as well as return precautions. Patient voices understanding of these instructions, accepts the plan, and is comfortable with discharge.   Vitals:   03/21/18 0630 03/21/18 0800 03/21/18 0845 03/21/18 0930  BP: (!) 115/57 132/60 (!) 111/55 (!) 117/58  Pulse: 70 64 83 (!) 51  Resp:  16  15  Temp:      TempSrc:      SpO2: 98% 95% (!) 75% 98%  Weight:      Height:         Final Clinical Impressions(s) / ED Diagnoses   Final diagnoses:  RUQ abdominal tenderness  Calculus of gallbladder without cholecystitis without obstruction  Hypokalemia    ED Discharge Orders         Ordered    dicyclomine (BENTYL) 20 MG tablet  2 times daily     03/21/18 1010    ondansetron (ZOFRAN ODT) 4 MG disintegrating tablet  Every 8 hours PRN     03/21/18 1010    famotidine (PEPCID) 20 MG tablet  2 times daily     03/21/18 1010    pantoprazole (PROTONIX) 20 MG tablet  Daily     03/21/18 1010           Joy, Hillard Danker, PA-C 03/21/18 1013    Ward, [place], DO 03/22/18 0107

## 2019-05-30 ENCOUNTER — Emergency Department (HOSPITAL_BASED_OUTPATIENT_CLINIC_OR_DEPARTMENT_OTHER)
Admission: EM | Admit: 2019-05-30 | Discharge: 2019-05-30 | Disposition: A | Discharge status: Home / Self Care | Payer: No Typology Code available for payment source | Attending: Emergency Medicine | Admitting: Emergency Medicine

## 2019-05-30 ENCOUNTER — Encounter (HOSPITAL_BASED_OUTPATIENT_CLINIC_OR_DEPARTMENT_OTHER): Payer: Self-pay | Admitting: Emergency Medicine

## 2019-05-30 ENCOUNTER — Other Ambulatory Visit: Payer: Self-pay

## 2019-05-30 DIAGNOSIS — Y92414 Local residential or business street as the place of occurrence of the external cause: Secondary | ICD-10-CM | POA: Diagnosis not present

## 2019-05-30 DIAGNOSIS — I10 Essential (primary) hypertension: Secondary | ICD-10-CM | POA: Diagnosis not present

## 2019-05-30 DIAGNOSIS — S46812A Strain of other muscles, fascia and tendons at shoulder and upper arm level, left arm, initial encounter: Secondary | ICD-10-CM | POA: Insufficient documentation

## 2019-05-30 DIAGNOSIS — Z885 Allergy status to narcotic agent status: Secondary | ICD-10-CM | POA: Diagnosis not present

## 2019-05-30 DIAGNOSIS — Z7984 Long term (current) use of oral hypoglycemic drugs: Secondary | ICD-10-CM | POA: Insufficient documentation

## 2019-05-30 DIAGNOSIS — Y9389 Activity, other specified: Secondary | ICD-10-CM | POA: Insufficient documentation

## 2019-05-30 DIAGNOSIS — E119 Type 2 diabetes mellitus without complications: Secondary | ICD-10-CM | POA: Insufficient documentation

## 2019-05-30 DIAGNOSIS — Y999 Unspecified external cause status: Secondary | ICD-10-CM | POA: Diagnosis not present

## 2019-05-30 DIAGNOSIS — Z79899 Other long term (current) drug therapy: Secondary | ICD-10-CM | POA: Insufficient documentation

## 2019-05-30 DIAGNOSIS — S4992XA Unspecified injury of left shoulder and upper arm, initial encounter: Secondary | ICD-10-CM | POA: Diagnosis present

## 2019-05-30 MED ORDER — KETOROLAC TROMETHAMINE 15 MG/ML IJ SOLN
15.0000 mg | Freq: Once | INTRAMUSCULAR | Status: AC
  Administered 2019-05-30: 15 mg via INTRAMUSCULAR
  Filled 2019-05-30: qty 1

## 2019-05-30 MED ORDER — ACETAMINOPHEN 500 MG PO TABS
1000.0000 mg | ORAL_TABLET | Freq: Once | ORAL | Status: AC
  Administered 2019-05-30: 1000 mg via ORAL
  Filled 2019-05-30: qty 2

## 2019-05-30 MED ORDER — OXYCODONE HCL 5 MG PO TABS
5.0000 mg | ORAL_TABLET | Freq: Once | ORAL | Status: AC
  Administered 2019-05-30: 5 mg via ORAL
  Filled 2019-05-30: qty 1

## 2019-05-30 NOTE — Discharge Instructions (Signed)
Take 4 over the counter ibuprofen tablets 3 times a day or 2 over-the-counter naproxen tablets twice a day for pain. Also take tylenol 1000mg (2 extra strength) four times a day.   The sling is just for comfort and to remind you not to use your left arm as much as you typically would.  You do need to take your arm out of the sling at least 4 times a day and perform range of motion exercises with it.
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
